# Patient Record
Sex: Male | Born: 1990 | Race: Black or African American | Hispanic: No | Marital: Married | Smoking: Never smoker
Health system: Southern US, Community
[De-identification: ages and names within clinical notes are randomized; demographics above are authoritative.]

---
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
## 2010-04-13 LAB — CONVERTED CEMR LAB
ALT: 17 units/L (ref 0–53)
AST: 38 units/L — ABNORMAL HIGH (ref 0–37)
Albumin: 5.2 g/dL (ref 3.5–5.2)
Alkaline Phosphatase: 107 units/L (ref 52–171)
BUN: 15 mg/dL (ref 6–23)
Basophils Absolute: 0 10*3/uL (ref 0.0–0.1)
Basophils Relative: 0 % (ref 0–1)
Bilirubin Urine: NEGATIVE
Blood in Urine, dipstick: NEGATIVE
CO2: 21 meq/L (ref 19–32)
Calcium: 10.1 mg/dL (ref 8.4–10.5)
Chlamydia, Swab/Urine, PCR: NEGATIVE
Chloride: 105 meq/L (ref 96–112)
Creatinine, Ser: 1.01 mg/dL (ref 0.40–1.50)
Eosinophils Absolute: 0.3 10*3/uL (ref 0.0–1.2)
Eosinophils Relative: 6 % — ABNORMAL HIGH (ref 0–5)
GC Probe Amp, Urine: NEGATIVE
Glucose, Bld: 77 mg/dL (ref 70–99)
Glucose, Urine, Semiquant: NEGATIVE
HCT: 45 % (ref 36.0–49.0)
Hemoglobin: 13.9 g/dL (ref 12.0–16.0)
Ketones, urine, test strip: NEGATIVE
Lymphocytes Relative: 40 % (ref 24–48)
Lymphs Abs: 2.1 10*3/uL (ref 1.1–4.8)
MCHC: 30.9 g/dL — ABNORMAL LOW (ref 31.0–37.0)
MCV: 102 fL — ABNORMAL HIGH (ref 78.0–98.0)
Monocytes Absolute: 0.5 10*3/uL (ref 0.2–1.2)
Monocytes Relative: 9 % (ref 3–11)
Neutro Abs: 2.4 10*3/uL (ref 1.7–8.0)
Neutrophils Relative %: 46 % (ref 43–71)
Nitrite: NEGATIVE
Platelets: 216 10*3/uL (ref 150–400)
Potassium: 5.1 meq/L (ref 3.5–5.3)
Protein, U semiquant: 30
RBC: 4.41 M/uL (ref 3.80–5.70)
RDW: 12.7 % (ref 11.4–15.5)
Sodium: 144 meq/L (ref 135–145)
Specific Gravity, Urine: >=1.03
TSH: 0.822 microintl units/mL (ref 0.350–5.50)
Total Bilirubin: 1 mg/dL (ref 0.3–1.2)
Total Protein: 8.3 g/dL (ref 6.0–8.3)
Urobilinogen, UA: 0.2
WBC Urine, dipstick: NEGATIVE
WBC: 5.3 10*3/uL (ref 4.5–13.5)
pH: 5.5

## 2010-04-15 NOTE — Letter (Signed)
Summary: AUTHORIZATION TO TALK TO MOTHER  AUTHORIZATION TO TALK TO MOTHER   Imported By: Arta Bruce 06/27/2009 14:14:29  _____________________________________________________________________  External Attachment:    Type:   Image     Comment:   External Document

## 2011-07-30 ENCOUNTER — Emergency Department (HOSPITAL_COMMUNITY)
Admission: EM | Admit: 2011-07-30 | Discharge: 2011-07-30 | Disposition: A | Discharge status: Home / Self Care | Payer: BC Managed Care – PPO | Source: Home / Self Care | Attending: Family Medicine | Admitting: Family Medicine

## 2011-07-30 ENCOUNTER — Encounter (HOSPITAL_COMMUNITY): Payer: Self-pay | Admitting: *Deleted

## 2011-07-30 DIAGNOSIS — L0100 Impetigo, unspecified: Secondary | ICD-10-CM

## 2011-07-30 MED ORDER — SULFAMETHOXAZOLE-TRIMETHOPRIM 800-160 MG PO TABS: 1.0000 | ORAL_TABLET | Freq: Two times a day (BID) | ORAL | Status: AC

## 2011-07-30 MED ORDER — MUPIROCIN CALCIUM 2 % NA OINT: TOPICAL_OINTMENT | NASAL | Status: DC

## 2011-07-30 NOTE — ED Provider Notes (Signed)
History     CSN: [id number]  Arrival date & time 07/30/11  1322   First MD Initiated Contact with Patient 07/30/11 1329      Chief Complaint  Patient presents with  . Rash    (Consider location/radiation/quality/duration/timing/severity/associated sxs/prior treatment) HPI Comments: The patient reports having a rash on both legs for several wks. States they for  Pustules and drain. Thighs are the only areas affected. Denies known hx of mrsa. No tx pta.   The history is provided by the patient.    History reviewed. No pertinent past medical history.  History reviewed. No pertinent past surgical history.  History reviewed. No pertinent family history.  History  Substance Use Topics  . Smoking status: Not on file  . Smokeless tobacco: Not on file  . Alcohol Use: Not on file      Review of Systems  Constitutional: Negative.   HENT: Negative.   Respiratory: Negative.   Gastrointestinal: Negative.   Genitourinary: Negative.   Musculoskeletal: Negative.     Allergies  Review of patient's allergies indicates not on file.  Home Medications   Current Outpatient Rx  Name Route Sig Dispense Refill  . MUPIROCIN CALCIUM 2 % NA OINT  Apply bid 10 g 0  . SULFAMETHOXAZOLE-TRIMETHOPRIM 800-160 MG PO TABS Oral Take 1 tablet by mouth every 12 (twelve) hours. 20 tablet 0    BP 129/83  Pulse 46  Temp(Src) 99 F (37.2 C) (Oral)  Resp 18  SpO2 100%  Physical Exam  Nursing note and vitals reviewed. Constitutional: He appears well-developed and well-nourished.  HENT:  Head: Atraumatic.  Cardiovascular: Normal rate and regular rhythm.   Pulmonary/Chest: Effort normal and breath sounds normal.  Skin: Skin is warm and dry.       Rash consistent with staff infection. Pustules noted. Lesions vary in sizes    ED Course  Procedures (including critical care time)  Labs Reviewed - No data to display No results found.   1. Impetigo       MDM          Randa Spike, MD 07/30/11 [phone]

## 2011-07-30 NOTE — ED Notes (Signed)
Pt  Reports    Rash  On  Both legs  For  Several weeks  Getting  Worse       The  Rash  Has  A  Red   Angry  Appearance  No  Known  Causative  Agents  Or  Any new  meds

## 2011-07-30 NOTE — Discharge Instructions (Signed)
Keep the areas clean. Impetigo Impetigo is an infection of the skin, most common in babies and children.  CAUSES  It is caused by staphylococcal or streptococcal germs (bacteria). Impetigo can start after any damage to the skin. The damage to the skin may be from things like:   Chickenpox.   Scrapes.   Scratches.   Insect bites (common when children scratch the bite).   Cuts.   Nail biting or chewing.  Impetigo is contagious. It can be spread from one person to another. Avoid close skin contact, or sharing towels or clothing. SYMPTOMS  Impetigo usually starts out as small blisters or pustules. Then they turn into tiny yellow-crusted sores (lesions).  There may also be:  Large blisters.   Itching or pain.   Pus.   Swollen lymph glands.  With scratching, irritation, or non-treatment, these small areas may get larger. Scratching can cause the germs to get under the fingernails; then scratching another part of the skin can cause the infection to be spread there. DIAGNOSIS  Diagnosis of impetigo is usually made by a physical exam. A skin culture (test to grow bacteria) may be done to prove the diagnosis or to help decide the best treatment.  TREATMENT  Mild impetigo can be treated with prescription antibiotic cream. Oral antibiotic medicine may be used in more severe cases. Medicines for itching may be used. HOME CARE INSTRUCTIONS   To avoid spreading impetigo to other body areas:   Keep fingernails short and clean.   Avoid scratching.   Cover infected areas if necessary to keep from scratching.   Gently wash the infected areas with antibiotic soap and water.   Soak crusted areas in warm soapy water using antibiotic soap.   Gently rub the areas to remove crusts. Do not scrub.   Wash hands often to avoid spread this infection.   Keep children with impetigo home from school or daycare until they have used an antibiotic cream for 48 hours (2 days) or oral antibiotic  medicine for 24 hours (1 day), and their skin shows significant improvement.   Children may attend school or daycare if they only have a few sores and if the sores can be covered by a bandage or clothing.  SEEK MEDICAL CARE IF:   More blisters or sores show up despite treatment.   Other family members get sores.   Rash is not improving after 48 hours (2 days) of treatment.  SEEK IMMEDIATE MEDICAL CARE IF:   You see spreading redness or swelling of the skin around the sores.   You see red streaks coming from the sores.   Your child develops a fever of 100.4 F (37.2 C) or higher.   Your child develops a sore throat.   Your child is acting ill (lethargic, sick to their stomach).  Document Released: 02/28/2000 Document Revised: 02/19/2011 Document Reviewed: 12/28/2007 [facility] Patient Information 2012 [place], [place].

## 2017-02-19 ENCOUNTER — Emergency Department (HOSPITAL_COMMUNITY)
Admission: EM | Admit: 2017-02-19 | Discharge: 2017-02-19 | Disposition: A | Discharge status: Home / Self Care | Payer: Self-pay | Attending: Emergency Medicine | Admitting: Emergency Medicine

## 2017-02-19 ENCOUNTER — Other Ambulatory Visit: Payer: Self-pay

## 2017-02-19 ENCOUNTER — Emergency Department (HOSPITAL_COMMUNITY): Payer: Self-pay

## 2017-02-19 ENCOUNTER — Encounter (HOSPITAL_COMMUNITY): Payer: Self-pay

## 2017-02-19 DIAGNOSIS — M546 Pain in thoracic spine: Secondary | ICD-10-CM | POA: Insufficient documentation

## 2017-02-19 DIAGNOSIS — Z041 Encounter for examination and observation following transport accident: Secondary | ICD-10-CM | POA: Insufficient documentation

## 2017-02-19 DIAGNOSIS — M542 Cervicalgia: Secondary | ICD-10-CM | POA: Insufficient documentation

## 2017-02-19 MED ORDER — IBUPROFEN 400 MG PO TABS
600.0000 mg | ORAL_TABLET | Freq: Once | ORAL | Status: DC
  Filled 2017-02-19: qty 1

## 2017-02-19 MED ORDER — METHOCARBAMOL 500 MG PO TABS
750.0000 mg | ORAL_TABLET | Freq: Once | ORAL | Status: AC
  Administered 2017-02-19: 750 mg via ORAL
  Filled 2017-02-19: qty 2

## 2017-02-19 MED ORDER — CYCLOBENZAPRINE HCL 10 MG PO TABS: 10.0000 mg | ORAL_TABLET | Freq: Every evening | ORAL | 0 refills | Status: DC | PRN

## 2017-02-19 MED ORDER — METHOCARBAMOL 500 MG PO TABS
750.0000 mg | ORAL_TABLET | Freq: Once | ORAL | Status: DC
  Filled 2017-02-19: qty 2

## 2017-02-19 MED ORDER — IBUPROFEN 400 MG PO TABS
600.0000 mg | ORAL_TABLET | Freq: Once | ORAL | Status: AC
  Administered 2017-02-19: 14:00:00 600 mg via ORAL
  Filled 2017-02-19: qty 1

## 2017-02-19 MED ORDER — NAPROXEN 500 MG PO TABS: 500.0000 mg | ORAL_TABLET | Freq: Two times a day (BID) | ORAL | 0 refills | Status: DC
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
## 2017-02-19 NOTE — ED Notes (Signed)
Patient transported to X-ray 
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
## 2017-02-19 NOTE — ED Notes (Signed)
Patient is in the room waiting for provider 

## 2019-12-03 ENCOUNTER — Other Ambulatory Visit: Payer: Self-pay

## 2019-12-03 ENCOUNTER — Emergency Department (HOSPITAL_COMMUNITY): Payer: 59

## 2019-12-03 ENCOUNTER — Emergency Department (HOSPITAL_COMMUNITY)
Admission: EM | Admit: 2019-12-03 | Discharge: 2019-12-04 | Disposition: A | Discharge status: Home / Self Care | Payer: 59 | Attending: Emergency Medicine | Admitting: Emergency Medicine

## 2019-12-03 DIAGNOSIS — Y92410 Unspecified street and highway as the place of occurrence of the external cause: Secondary | ICD-10-CM | POA: Diagnosis not present

## 2019-12-03 DIAGNOSIS — Z5321 Procedure and treatment not carried out due to patient leaving prior to being seen by health care provider: Secondary | ICD-10-CM | POA: Diagnosis not present

## 2019-12-03 DIAGNOSIS — M549 Dorsalgia, unspecified: Secondary | ICD-10-CM | POA: Insufficient documentation

## 2019-12-03 DIAGNOSIS — R0781 Pleurodynia: Secondary | ICD-10-CM | POA: Diagnosis present
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
## 2019-12-04 ENCOUNTER — Ambulatory Visit (HOSPITAL_COMMUNITY)
Admission: EM | Admit: 2019-12-04 | Discharge: 2019-12-04 | Disposition: A | Discharge status: Home / Self Care | Payer: 59 | Attending: Internal Medicine | Admitting: Internal Medicine

## 2019-12-04 ENCOUNTER — Encounter (HOSPITAL_COMMUNITY): Payer: Self-pay

## 2019-12-04 ENCOUNTER — Other Ambulatory Visit: Payer: Self-pay

## 2019-12-04 DIAGNOSIS — S2242XA Multiple fractures of ribs, left side, initial encounter for closed fracture: Secondary | ICD-10-CM | POA: Diagnosis not present

## 2019-12-04 MED ORDER — KETOROLAC TROMETHAMINE 30 MG/ML IJ SOLN
INTRAMUSCULAR | Status: AC
  Filled 2019-12-04: qty 1

## 2019-12-04 MED ORDER — KETOROLAC TROMETHAMINE 30 MG/ML IJ SOLN
30.0000 mg | Freq: Once | INTRAMUSCULAR | Status: AC
  Administered 2019-12-04: 30 mg via INTRAMUSCULAR

## 2019-12-04 MED ORDER — CYCLOBENZAPRINE HCL 10 MG PO TABS: 10.0000 mg | ORAL_TABLET | Freq: Three times a day (TID) | ORAL | 0 refills | Status: DC | PRN

## 2019-12-04 MED ORDER — IBUPROFEN 600 MG PO TABS: 600.0000 mg | ORAL_TABLET | Freq: Four times a day (QID) | ORAL | 0 refills | Status: DC | PRN
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
## 2020-01-01 ENCOUNTER — Emergency Department (HOSPITAL_COMMUNITY): Payer: 59

## 2020-01-01 ENCOUNTER — Emergency Department (HOSPITAL_COMMUNITY)
Admission: EM | Admit: 2020-01-01 | Discharge: 2020-01-01 | Disposition: A | Discharge status: Home / Self Care | Payer: 59 | Attending: Emergency Medicine | Admitting: Emergency Medicine

## 2020-01-01 ENCOUNTER — Encounter (HOSPITAL_COMMUNITY): Payer: Self-pay

## 2020-01-01 DIAGNOSIS — W3400XA Accidental discharge from unspecified firearms or gun, initial encounter: Secondary | ICD-10-CM | POA: Diagnosis not present

## 2020-01-01 DIAGNOSIS — Z23 Encounter for immunization: Secondary | ICD-10-CM | POA: Diagnosis not present

## 2020-01-01 DIAGNOSIS — S61402A Unspecified open wound of left hand, initial encounter: Secondary | ICD-10-CM | POA: Insufficient documentation

## 2020-01-01 MED ORDER — LIDOCAINE HCL 1 % IJ SOLN
5.0000 mL | Freq: Once | INTRAMUSCULAR | Status: AC
  Filled 2020-01-01: qty 10

## 2020-01-01 MED ORDER — CEPHALEXIN 500 MG PO CAPS: 500.0000 mg | ORAL_CAPSULE | Freq: Four times a day (QID) | ORAL | 0 refills | Status: DC

## 2020-01-01 MED ORDER — TETANUS-DIPHTH-ACELL PERTUSSIS 5-2.5-18.5 LF-MCG/0.5 IM SUSP
0.5000 mL | Freq: Once | INTRAMUSCULAR | Status: AC
  Administered 2020-01-01: 0.5 mL via INTRAMUSCULAR
  Filled 2020-01-01: qty 0.5

## 2020-01-01 MED ORDER — CEFTRIAXONE SODIUM 1 G IJ SOLR
1.0000 g | Freq: Once | INTRAMUSCULAR | Status: AC
  Administered 2020-01-01: 1 g via INTRAMUSCULAR
  Filled 2020-01-01: qty 10

## 2020-01-01 MED ORDER — LIDOCAINE HCL (PF) 1 % IJ SOLN
INTRAMUSCULAR | Status: AC
  Administered 2020-01-01: 5 mL via INTRADERMAL
  Filled 2020-01-01: qty 5
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
## 2020-04-18 ENCOUNTER — Ambulatory Visit (HOSPITAL_COMMUNITY)
Admission: EM | Admit: 2020-04-18 | Discharge: 2020-04-18 | Disposition: A | Discharge status: Home / Self Care | Payer: 59 | Attending: Family Medicine | Admitting: Family Medicine

## 2020-04-18 ENCOUNTER — Encounter (HOSPITAL_COMMUNITY): Payer: Self-pay

## 2020-04-18 ENCOUNTER — Other Ambulatory Visit: Payer: Self-pay

## 2020-04-18 DIAGNOSIS — K047 Periapical abscess without sinus: Secondary | ICD-10-CM

## 2020-04-18 MED ORDER — AMOXICILLIN-POT CLAVULANATE 875-125 MG PO TABS: 1.0000 | ORAL_TABLET | Freq: Two times a day (BID) | ORAL | 0 refills | Status: DC

## 2020-04-18 MED ORDER — LIDOCAINE VISCOUS HCL 2 % MT SOLN: 10.0000 mL | OROMUCOSAL | 0 refills | Status: DC | PRN

## 2020-04-18 NOTE — ED Provider Notes (Signed)
[facility]    CSN: [id number] Arrival date & time: 04/18/20  1443      History   Chief Complaint Chief Complaint  Patient presents with  . Abscess    HPI Matthew Huang is a 30 y.o. male.   Here today with about 4 day history of right lower dental pain and now marked jaw swelling and tenderness. Has had low grade fever today. No drainage from area, chills, sweats, CP, SOB, dizziness, headaches. Taking ibuprofen here and there with mild temporary relief. Just got a dentist but unable to get an appt in the next few days. Known broken teeth in the area.      History reviewed. No pertinent past medical history.  Patient Active Problem List   Diagnosis Date Noted  . 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 INFECTION, SKIN AND SOFT TISSUE 04/10/2008  . KNEE PAIN, LEFT 04/10/2008  . BRADYCARDIA 06/14/2007    History reviewed. No pertinent surgical history.     Home Medications    Prior to Admission medications   Medication Sig Start Date End Date Taking? Authorizing Provider  amoxicillin-clavulanate (AUGMENTIN) 875-125 MG tablet Take 1 tablet by mouth every 12 (twelve) hours. 04/18/20  Yes Particia Nearing, PA-C  lidocaine (XYLOCAINE) 2 % solution Use as directed 10 mLs in the mouth or throat as needed for mouth pain. 04/18/20  Yes Particia Nearing, PA-C  cephALEXin (KEFLEX) 500 MG capsule Take 1 capsule (500 mg total) by mouth 4 (four) times daily. 01/01/20   Sabino Donovan, MD  cyclobenzaprine (FLEXERIL) 10 MG tablet Take 1 tablet (10 mg total) by mouth 3 (three) times daily as needed for muscle spasms. 12/04/19   Merrilee Jansky, MD  ibuprofen (ADVIL) 600 MG tablet Take 1 tablet (600 mg total) by mouth every 6 (six) hours as needed. 12/04/19   Merrilee Jansky, MD  mupirocin nasal ointment (BACTROBAN) 2 % Apply bid 07/30/11 12/04/19  Randa Spike, DO    Family History History reviewed. No pertinent family history.  Social History Social History   Tobacco  Use  . Smoking status: Never Smoker  . Smokeless tobacco: Never Used  Substance Use Topics  . Alcohol use: Yes    Comment: occ  . Drug use: No     Allergies   Patient has no known allergies.   Review of Systems Review of Systems PER HPI  Physical Exam Triage Vital Signs ED Triage Vitals [04/18/20 1533]  Enc Vitals Group     BP 136/73     Pulse Rate 66     Resp 17     Temp 100 F (37.8 C)     Temp src      SpO2 100 %     Weight      Height      Head Circumference      Peak Flow      Pain Score 9     Pain Loc      Pain Edu?      Excl. in GC?    No data found.  Updated Vital Signs BP 136/73   Pulse 66   Temp 100 F (37.8 C)   Resp 17   SpO2 100%   Visual Acuity Right Eye Distance:   Left Eye Distance:   Bilateral Distance:    Right Eye Near:   Left Eye Near:    Bilateral Near:     Physical Exam Vitals and nursing note reviewed.  Constitutional:  Appearance: Normal appearance.  HENT:     Head: Atraumatic.     Mouth/Throat:     Mouth: Mucous membranes are moist.     Comments: Significant right lower jaw swelling, ttp Several broken molars right lower jaw, areas of decay present Eyes:     Extraocular Movements: Extraocular movements intact.     Conjunctiva/sclera: Conjunctivae normal.  Cardiovascular:     Rate and Rhythm: Normal rate and regular rhythm.  Pulmonary:     Effort: Pulmonary effort is normal.     Breath sounds: Normal breath sounds.  Musculoskeletal:        General: Normal range of motion.     Cervical back: Normal range of motion and neck supple.  Skin:    General: Skin is warm and dry.  Neurological:     General: No focal deficit present.     Mental Status: He is oriented to person, place, and time.  Psychiatric:        Mood and Affect: Mood normal.        Thought Content: Thought content normal.        Judgment: Judgment normal.     UC Treatments / Results  Labs (all labs ordered are listed, but only abnormal  results are displayed) Labs Reviewed - No data to display  EKG   Radiology No results found.  Procedures Procedures (including critical care time)  Medications Ordered in UC Medications - No data to display  Initial Impression / Assessment and Plan / UC Course  I have reviewed the triage vital signs and the nursing notes.  Pertinent labs & imaging results that were available during my care of the patient were reviewed by me and considered in my medical decision making (see chart for details).     Will start augmentin, viscous lidocaine, and OTC pain relievers. Close dental f/u recommended. Return if worsening or not resolving.   Final Clinical Impressions(s) / UC Diagnoses   Final diagnoses:  Dental infection   Discharge Instructions   None    ED Prescriptions    Medication Sig Dispense Auth. Provider   amoxicillin-clavulanate (AUGMENTIN) 875-125 MG tablet Take 1 tablet by mouth every 12 (twelve) hours. 14 tablet Particia Nearing, [place]   lidocaine (XYLOCAINE) 2 % solution Use as directed 10 mLs in the mouth or throat as needed for mouth pain. 100 mL Particia Nearing, [place]     PDMP not reviewed this encounter.   Particia Nearing, [place] 04/18/20 [phone]

## 2020-04-18 NOTE — ED Triage Notes (Signed)
Pt in with c/o right jaw swelling that he noticed 4 days ago. States it feels like an abscess  Pt has been taking ibuprofen for pain

## 2021-07-13 ENCOUNTER — Ambulatory Visit (HOSPITAL_COMMUNITY)
Admission: EM | Admit: 2021-07-13 | Discharge: 2021-07-13 | Disposition: A | Discharge status: Home / Self Care | Payer: Self-pay

## 2021-07-13 ENCOUNTER — Encounter (HOSPITAL_COMMUNITY): Payer: Self-pay | Admitting: Emergency Medicine

## 2021-07-13 ENCOUNTER — Ambulatory Visit (HOSPITAL_COMMUNITY)
Admission: EM | Admit: 2021-07-13 | Discharge: 2021-07-13 | Disposition: A | Discharge status: Home / Self Care | Payer: Self-pay | Attending: Emergency Medicine | Admitting: Emergency Medicine

## 2021-07-13 DIAGNOSIS — R200 Anesthesia of skin: Secondary | ICD-10-CM

## 2021-07-13 DIAGNOSIS — B354 Tinea corporis: Secondary | ICD-10-CM

## 2021-07-13 MED ORDER — GABAPENTIN 100 MG PO CAPS: 100.0000 mg | ORAL_CAPSULE | Freq: Every day | ORAL | 0 refills | Status: DC
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
## 2021-07-13 NOTE — ED Provider Notes (Signed)
?[facility] ? ? ? ?CSN: [id number] ?Arrival date & time: 07/13/21  1717 ? ? ?  ? ?History   ?Chief Complaint ?Chief Complaint  ?Patient presents with  ? Numbness  ? ? ?HPI ?Matthew Huang is a 31 y.o. male.  ? ?Patient presents with tingling to bilateral fingers for 5 days.  Tingling has been constant and has not worsened.  Does not extend past fingertips.  Range of motion intact.  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  Denies blistering, discoloration, numbness.  Has not attempted treatment. ? ?Patient concerned with rash present to the chest and neck, unsure of timeline of presents.  Rash is pruritic but nondraining.  Has not spread but has worsened.  Denies fever, chills .  Endorses that he has had similar rash in the past that cleared with the use of head and shoulder shampoo.   ? ?History reviewed. No pertinent past medical history. ? ?Patient Active Problem List  ? Diagnosis Date Noted  ? 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 INFECTION, SKIN AND SOFT TISSUE 04/10/2008  ? KNEE PAIN, LEFT 04/10/2008  ? 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 ? ?History reviewed. No pertinent surgical history. ? ? ? ? ?Home Medications   ? ?Prior to Admission medications   ?Medication Sig Start Date End Date Taking? Authorizing Provider  ?amoxicillin-clavulanate (AUGMENTIN) 875-125 MG tablet Take 1 tablet by mouth every 12 (twelve) hours. 04/18/20   Particia Nearing, PA-C  ?cephALEXin (KEFLEX) 500 MG capsule Take 1 capsule (500 mg total) by mouth 4 (four) times daily. 01/01/20   Sabino Donovan, MD  ?cyclobenzaprine (FLEXERIL) 10 MG tablet Take 1 tablet (10 mg total) by mouth 3 (three) times daily as needed for muscle spasms. 12/04/19   LampteyBritta Mccreedy, MD  ?ibuprofen (ADVIL) 600 MG tablet Take 1 tablet (600 mg total) by mouth every 6 (six) hours as needed. 12/04/19   LampteyBritta Mccreedy, MD  ?lidocaine (XYLOCAINE) 2 % solution Use as directed 10 mLs in the mouth or throat as needed  for mouth pain. 04/18/20   Particia Nearing, PA-C  ?mupirocin nasal ointment (BACTROBAN) 2 % Apply bid 07/30/11 12/04/19  Randa Spike, DO  ? ? ?Family History ?History reviewed. No pertinent family history. ? ?Social History ?Social History  ? ?Tobacco Use  ? Smoking status: Never  ? Smokeless tobacco: Never  ?Substance Use Topics  ? Alcohol use: Yes  ?  Comment: occ  ? Drug use: No  ? ? ? ?Allergies   ?Patient has no known allergies. ? ? ?Review of Systems ?Review of Systems ?Defer to HPI  ? ? ?Physical Exam ?Triage Vital Signs ?ED Triage Vitals  ?Enc Vitals Group  ?   BP 07/13/21 1803 126/84  ?   Pulse Rate 07/13/21 1803 62  ?   Resp 07/13/21 1803 17  ?   Temp 07/13/21 1803 98.3 ?F (36.8 ?C)  ?   Temp Source 07/13/21 1803 Oral  ?   SpO2 07/13/21 1803 97 %  ?   Weight 07/13/21 1802 145 lb 1 oz (65.8 kg)  ?   Height 07/13/21 1802 5\' 7"  (1.702 m)  ?   Head Circumference --   ?   Peak Flow --   ?   Pain Score 07/13/21 1802 7  ?   Pain Loc --   ?   Pain Edu? --   ?   Excl. in GC? --   ? ?  No data found. ? ?Updated Vital Signs ?BP 126/84 (BP Location: Right Arm)   Pulse 62   Temp 98.3 ?F (36.8 ?C) (Oral)   Resp 17   Ht 5\' 7"  (1.702 m)   Wt 145 lb 1 oz (65.8 kg)   SpO2 97%   BMI 22.72 kg/m?  ? ?Visual Acuity ?Right Eye Distance:   ?Left Eye Distance:   ?Bilateral Distance:   ? ?Right Eye Near:   ?Left Eye Near:    ?Bilateral Near:    ? ?Physical Exam ?Constitutional:   ?   Appearance: Normal appearance.  ?HENT:  ?   Head: Normocephalic.  ?Eyes:  ?   Extraocular Movements: Extraocular movements intact.  ?Pulmonary:  ?   Effort: Pulmonary effort is normal.  ?Musculoskeletal:  ?   Comments: Has a heightened sensation in all 5 fingers bilaterally, does not extend past the distal phalanx, range of motion intact, capillary refill less than 3, no involvement of the palm or wrist  ?Skin: ?   Comments: Circular shaped hypopigmentation scattered over the abdomen, chest, anterior neck, mild scaling noted   ?Neurological:  ?   Mental Status: He is alert and oriented to person, place, and time. Mental status is at baseline.  ?Psychiatric:     ?   Mood and Affect: Mood normal.     ?   Behavior: Behavior normal.  ? ? ? ?UC Treatments / Results  ?Labs ?(all labs ordered are listed, but only abnormal results are displayed) ?Labs Reviewed - No data to display ? ?EKG ? ? ?Radiology ?No results found. ? ?Procedures ?Procedures (including critical care time) ? ?Medications Ordered in UC ?Medications - No data to display ? ?Initial Impression / Assessment and Plan / UC Course  ?I have reviewed the triage vital signs and the nursing notes. ? ?Pertinent labs & imaging results that were available during my care of the patient were reviewed by me and considered in my medical decision making (see chart for details). ? ?Numbness of fingers of both hands ?Tinea corporis ? ? ?Unknown etiology of symptoms, possibly related to exposure to cold temperatures, discussed with patient, no signs of blistering, discoloration, sensation is currently intact, prescribe gabapentin 100 mg at bedtime daily for management of tingling, if symptoms continue to persist, discussed with patient follow-up with vascular ? ?Symptomology of rash is consistent with tinea corporis as patient has endorsed that symptoms have cleared with over-the-counter antifungal shampoo, recommended restarting and using 5 days past resolution, they may use as needed, may follow-up at urgent care if symptoms worsen ?Final Clinical Impressions(s) / UC Diagnoses  ? ?Final diagnoses:  ?None  ? ?Discharge Instructions   ?None ?  ? ?ED Prescriptions   ?None ?  ? ?PDMP not reviewed this encounter. ?  ? , NP ?07/16/21 1000 ? ?

## 2021-07-13 NOTE — Discharge Instructions (Addendum)
For your fingers ? 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 ?I would like you to follow-up with vascular for evaluation of your circulation if your symptoms continue to persist, information has been listed below ? ?Begin use of gabapentin daily before bed to see if this will help minimize your tingling sensation ? 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 ?For your rash ? ?Symptoms are consistent with a fungal rash, if it has been affected to use over-the-counter shampoos you may continue to do so with follow-up with urgent care if shampoo ever becomes ineffective ?

## 2021-07-13 NOTE — ED Notes (Signed)
No answer from lobby  

## 2021-08-29 ENCOUNTER — Ambulatory Visit (INDEPENDENT_AMBULATORY_CARE_PROVIDER_SITE_OTHER): Payer: Self-pay

## 2021-08-29 ENCOUNTER — Encounter (HOSPITAL_COMMUNITY): Payer: Self-pay

## 2021-08-29 ENCOUNTER — Ambulatory Visit (HOSPITAL_COMMUNITY)
Admission: EM | Admit: 2021-08-29 | Discharge: 2021-08-29 | Disposition: A | Discharge status: Home / Self Care | Payer: Self-pay | Attending: Emergency Medicine | Admitting: Emergency Medicine

## 2021-08-29 DIAGNOSIS — L0201 Cutaneous abscess of face: Secondary | ICD-10-CM

## 2021-08-29 DIAGNOSIS — B354 Tinea corporis: Secondary | ICD-10-CM

## 2021-08-29 DIAGNOSIS — M25562 Pain in left knee: Secondary | ICD-10-CM

## 2021-08-29 MED ORDER — CLOTRIMAZOLE 1 % EX CREA: TOPICAL_CREAM | CUTANEOUS | 0 refills | Status: DC

## 2021-08-29 MED ORDER — IBUPROFEN 800 MG PO TABS
800.0000 mg | ORAL_TABLET | Freq: Once | ORAL | Status: AC
  Administered 2021-08-29: 800 mg via ORAL

## 2021-08-29 MED ORDER — AMOXICILLIN-POT CLAVULANATE 875-125 MG PO TABS: 1.0000 | ORAL_TABLET | Freq: Two times a day (BID) | ORAL | 0 refills | Status: AC

## 2021-08-29 MED ORDER — IBUPROFEN 800 MG PO TABS
ORAL_TABLET | ORAL | Status: AC
  Filled 2021-08-29: qty 1

## 2021-08-29 NOTE — Discharge Instructions (Addendum)
Please follow up with orthopedics. Alternate tylenol and ibuprofen every 6 hours for pain. Continue to use the ACE bandage wrap, elevate the leg, and apply ice to the area.  For the abscess, take the antibiotic medicine twice daily for 7 days. Please follow up with your dentist.   For the rash, apply the antifungal cream once or twice a day to the areas. Follow up with your primary care if this persists.

## 2021-08-29 NOTE — ED Provider Notes (Signed)
[facility]    CSN: [id number] Arrival date & time: 08/29/21  1831      History   Chief Complaint Chief Complaint  Patient presents with   Knee Injury   Abscess    HPI Matthew Huang is a 31 y.o. male.  2 day history of left knee pain after injury during basketball game. Pain and swelling. Has been applying ACE bandage.   Abscess to right lower face on cheek. Has history of this in the past.   Seen a few months ago for rash that was treated with antifungal shampoo. Rash has persisted.   History reviewed. No pertinent past medical history.  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 No pertinent surgical history.     Home Medications    Prior to Admission medications   Medication Sig Start Date End Date Taking? Authorizing Provider  amoxicillin-clavulanate (AUGMENTIN) 875-125 MG tablet Take 1 tablet by mouth 2 (two) times daily for 7 days. 08/29/21 09/05/21 Yes Lovie Zarling, Lurena Joiner, PA-C  clotrimazole (LOTRIMIN) 1 % cream Apply to affected area 2 times daily 08/29/21  Yes Pratham Cassatt, Lurena Joiner, PA-C  mupirocin nasal ointment (BACTROBAN) 2 % Apply bid 07/30/11 12/04/19  Randa Spike, DO    Family History History reviewed. No pertinent family history.  Social History Social History   Tobacco Use   Smoking status: Never   Smokeless tobacco: Never  Substance Use Topics   Alcohol use: Yes    Comment: occ   Drug use: No     Allergies   Patient has no known allergies.   Review of Systems Review of Systems Per HPI   Physical Exam Triage Vital Signs ED Triage Vitals  Enc Vitals Group     BP 08/29/21 1925 124/76     Pulse Rate 08/29/21 1925 65     Resp 08/29/21 1925 18     Temp 08/29/21 1925 98.3 F (36.8 C)     Temp Source 08/29/21 1925 Oral     SpO2 08/29/21 1925 100 %     Weight --       Height --      Head Circumference --      Peak Flow --      Pain Score 08/29/21 1926 10     Pain Loc --      Pain Edu? --      Excl. in GC? --    No data found.  Updated Vital Signs BP 124/76 (BP Location: Left Arm)   Pulse 65   Temp 98.3 F (36.8 C) (Oral)   Resp 18   SpO2 100%   Visual Acuity Right Eye Distance:   Left Eye Distance:   Bilateral Distance:    Right Eye Near:   Left Eye Near:    Bilateral Near:     Physical Exam Skin:    Findings: Rash present.     Comments: Brown macular, circular rash consistent with tinea corporis, to right chest and right back     UC Treatments / Results  Labs (all labs ordered are listed, but only abnormal results are displayed) Labs Reviewed - No data to display  EKG   Radiology DG Knee AP/LAT W/Sunrise Left  Result Date: 08/29/2021 CLINICAL DATA:  Trauma to the left knee. EXAM: LEFT KNEE 3 VIEWS COMPARISON:  None Available.  FINDINGS: No evidence of fracture, dislocation, or joint effusion. No evidence of arthropathy or other focal bone abnormality. Soft tissues are unremarkable. IMPRESSION: Negative. Electronically Signed   By: Elgie Collard M.D.   On: 08/29/2021 20:18    Procedures Procedures (including critical care time)  Medications Ordered in UC Medications  ibuprofen (ADVIL) tablet 800 mg (800 mg Oral Given 08/29/21 2002)    Initial Impression / Assessment and Plan / UC Course  I have reviewed the triage vital signs and the nursing notes.  Pertinent labs & imaging results that were available during my care of the patient were reviewed by me and considered in my medical decision making (see chart for details).  Ibuprofen dose given.   Knee xray negative. Follow up with ortho. Clotrimazole cream for rash Antibiotic and follow up with dentist for facial abscess. Return precautions discussed.  Final Clinical Impressions(s) / UC Diagnoses   Final diagnoses:  Abscess of face  Acute pain of left knee   Tinea corporis     Discharge Instructions      Please follow up with orthopedics. Alternate tylenol and ibuprofen every 6 hours for pain. Continue to use the ACE bandage wrap, elevate the leg, and apply ice to the area.  For the abscess, take the antibiotic medicine twice daily for 7 days. Please follow up with your dentist.   For the rash, apply the antifungal cream once or twice a day to the areas. Follow up with your primary care if this persists.    ED Prescriptions     Medication Sig Dispense Auth. Provider   amoxicillin-clavulanate (AUGMENTIN) 875-125 MG tablet Take 1 tablet by mouth 2 (two) times daily for 7 days. 14 tablet Jareth Pardee, PA-C   clotrimazole (LOTRIMIN) 1 % cream Apply to affected area 2 times daily 15 g Gudrun Axe, Lurena Joiner, PA-C      PDMP not reviewed this encounter.

## 2021-08-29 NOTE — ED Triage Notes (Signed)
Pt states playing basketball and some ones knee when into the back of his lt knee. C/o pain and swelling x2 days.  C/o abscess to rt lower cheek x2 days. Denies toothache but does have a cracked tooth there.  States was here a few months back about a rash to abdomen and its still there.
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
## 2022-01-24 ENCOUNTER — Encounter (HOSPITAL_COMMUNITY): Payer: Self-pay | Admitting: Emergency Medicine

## 2022-01-24 ENCOUNTER — Ambulatory Visit (HOSPITAL_COMMUNITY)
Admission: EM | Admit: 2022-01-24 | Discharge: 2022-01-24 | Disposition: A | Discharge status: Home / Self Care | Payer: Medicaid Other | Attending: Emergency Medicine | Admitting: Emergency Medicine

## 2022-01-24 DIAGNOSIS — L0201 Cutaneous abscess of face: Secondary | ICD-10-CM

## 2022-01-24 MED ORDER — DOXYCYCLINE HYCLATE 100 MG PO CAPS: 100.0000 mg | ORAL_CAPSULE | Freq: Two times a day (BID) | ORAL | 0 refills | Status: DC

## 2022-01-24 NOTE — Discharge Instructions (Signed)
Due to the firmness of your abscess today we are not able to open and drain it as minimal pus will come out however the site has already started to drain and and most likely will continue to do so, the antibiotic will help this  Take doxycycline every morning and every evening for 7 days  Hold warm-hot compresses to affected area at least 4 times a day, this helps to facilitate draining, the more the better  Please return for evaluation for increased swelling, increased tenderness or pain, non healing site, non draining site, you begin to have fever or chills   We reviewed the etiology of recurrent abscesses of skin.  Skin abscesses are collections of pus within the dermis and deeper skin tissues. Skin abscesses manifest as painful, tender, fluctuant, and erythematous nodules, frequently surmounted by a pustule and surrounded by a rim of erythematous swelling.  Spontaneous drainage of purulent material may occur.  Fever can occur on occasion.    -Skin abscesses can develop in healthy individuals with no predisposing conditions other than skin or nasal carriage of Staphylococcus aureus.  Individuals in close contact with others who have active infection with skin abscesses are at increased risk which is likely to explain why twin brother has similar episodes.   In addition, any process leading to a breach in the skin barrier can also predispose to the development of a skin abscesses, such as atopic dermatitis.

## 2022-01-24 NOTE — ED Provider Notes (Signed)
[facility]    CSN: [id number] Arrival date & time: 01/24/22  1450      History   Chief Complaint Chief Complaint  Patient presents with   Facial Abscess    HPI Matthew Huang is a 31 y.o. male.   Patient presents with abscess to the right cheek beginning 1 day ago.  Endorses it has increased in size and become painful and has begun to drain.  Has occurred prior.  Denies shaving.  Denies fever or chills.  Has not attempted treatment.  History reviewed. No pertinent past medical history.  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 No pertinent surgical history.     Home Medications    Prior to Admission medications   Medication Sig Start Date End Date Taking? Authorizing Provider  doxycycline (VIBRAMYCIN) 100 MG capsule Take 1 capsule (100 mg total) by mouth 2 (two) times daily. 01/24/22  Yes Valinda Hoar, NP  clotrimazole (LOTRIMIN) 1 % cream Apply to affected area 2 times daily 08/29/21   Rising, Lurena Joiner, PA-C  mupirocin nasal ointment (BACTROBAN) 2 % Apply bid 07/30/11 12/04/19  Randa Spike, DO    Family History History reviewed. No pertinent family history.  Social History Social History   Tobacco Use   Smoking status: Never   Smokeless tobacco: Never  Substance Use Topics   Alcohol use: Yes    Comment: occ   Drug use: No     Allergies   Patient has no known allergies.   Review of Systems Review of Systems  Constitutional: Negative.   Respiratory: Negative.    Cardiovascular: Negative.   Skin:  Positive for wound. Negative for color change, pallor and rash.     Physical Exam Triage Vital Signs ED Triage Vitals  Enc Vitals Group     BP 01/24/22 1603 125/82     Pulse Rate 01/24/22 1603 62     Resp 01/24/22 1603 18     Temp 01/24/22 1603 98.2 F (36.8 C)      Temp Source 01/24/22 1603 Oral     SpO2 01/24/22 1603 98 %     Weight --      Height --      Head Circumference --      Peak Flow --      Pain Score 01/24/22 1602 10     Pain Loc --      Pain Edu? --      Excl. in GC? --    No data found.  Updated Vital Signs BP 125/82 (BP Location: Left Arm)   Pulse 62   Temp 98.2 F (36.8 C) (Oral)   Resp 18   SpO2 98%   Visual Acuity Right Eye Distance:   Left Eye Distance:   Bilateral Distance:    Right Eye Near:   Left Eye Near:    Bilateral Near:     Physical Exam Constitutional:      Appearance: Normal appearance.  HENT:     Head: Normocephalic.  Eyes:     Extraocular Movements: Extraocular movements intact.  Pulmonary:     Effort: Pulmonary effort is normal.  Skin:    Comments: 3 x 4 firm abscess present to the right external cheek, yellow puslike drainage present to the center pinpoint  Neurological:     Mental  Status: He is alert and oriented to person, place, and time. Mental status is at baseline.  Psychiatric:        Mood and Affect: Mood normal.        Behavior: Behavior normal.      UC Treatments / Results  Labs (all labs ordered are listed, but only abnormal results are displayed) Labs Reviewed - No data to display  EKG   Radiology No results found.  Procedures Procedures (including critical care time)  Medications Ordered in UC Medications - No data to display  Initial Impression / Assessment and Plan / UC Course  I have reviewed the triage vital signs and the nursing notes.  Pertinent labs & imaging results that were available during my care of the patient were reviewed by me and considered in my medical decision making (see chart for details).  Abscess of external cheek, right  Abscess is firm at this time and unable to undergo incision and drainage, discussed with patient, yellow purulent drainage already expelling from site therefore most likely will continue to do so, doxycycline  prescribed and amended warm compresses to the affected area to help facilitate drainage, may use over-the-counter analgesics for management of pain, advised to return urgent care for nonhealing site for reevaluation as needed Final Clinical Impressions(s) / UC Diagnoses   Final diagnoses:  Abscess of external cheek, left     Discharge Instructions      Due to the firmness of your abscess today we are not able to open and drain it as minimal pus will come out however the site has already started to drain and and most likely will continue to do so, the antibiotic will help this  Take doxycycline every morning and every evening for 7 days  Hold warm-hot compresses to affected area at least 4 times a day, this helps to facilitate draining, the more the better  Please return for evaluation for increased swelling, increased tenderness or pain, non healing site, non draining site, you begin to have fever or chills   We reviewed the etiology of recurrent abscesses of skin.  Skin abscesses are collections of pus within the dermis and deeper skin tissues. Skin abscesses manifest as painful, tender, fluctuant, and erythematous nodules, frequently surmounted by a pustule and surrounded by a rim of erythematous swelling.  Spontaneous drainage of purulent material may occur.  Fever can occur on occasion.    -Skin abscesses can develop in healthy individuals with no predisposing conditions other than skin or nasal carriage of Staphylococcus aureus.  Individuals in close contact with others who have active infection with skin abscesses are at increased risk which is likely to explain why twin brother has similar episodes.   In addition, any process leading to a breach in the skin barrier can also predispose to the development of a skin abscesses, such as atopic dermatitis.      ED Prescriptions     Medication Sig Dispense Auth. Provider   doxycycline (VIBRAMYCIN) 100 MG capsule Take 1 capsule (100 mg  total) by mouth 2 (two) times daily. 14 capsule Haevyn Ury, Elita Boone, NP      PDMP not reviewed this encounter.   Valinda Hoar, NP 01/24/22 1649

## 2022-01-24 NOTE — ED Triage Notes (Signed)
Pt reports a facial abscess on the right side. States it has been there for months but returned lastnight.

## 2023-04-26 ENCOUNTER — Encounter (HOSPITAL_COMMUNITY): Payer: Self-pay

## 2023-04-26 ENCOUNTER — Ambulatory Visit (HOSPITAL_COMMUNITY)
Admission: EM | Admit: 2023-04-26 | Discharge: 2023-04-26 | Disposition: A | Discharge status: Home / Self Care | Payer: Medicaid Other | Attending: Emergency Medicine | Admitting: Emergency Medicine

## 2023-04-26 DIAGNOSIS — L739 Follicular disorder, unspecified: Secondary | ICD-10-CM

## 2023-04-26 DIAGNOSIS — F1290 Cannabis use, unspecified, uncomplicated: Secondary | ICD-10-CM

## 2023-04-26 DIAGNOSIS — K047 Periapical abscess without sinus: Secondary | ICD-10-CM

## 2023-04-26 MED ORDER — MUPIROCIN 2 % EX OINT: 1.0000 | TOPICAL_OINTMENT | Freq: Two times a day (BID) | CUTANEOUS | 0 refills | Status: DC

## 2023-04-26 MED ORDER — AMOXICILLIN 500 MG PO CAPS: 500.0000 mg | ORAL_CAPSULE | Freq: Three times a day (TID) | ORAL | 0 refills | Status: DC

## 2023-04-26 MED ORDER — MUPIROCIN 2 % EX OINT: 1.0000 | TOPICAL_OINTMENT | Freq: Two times a day (BID) | CUTANEOUS | 0 refills | Status: AC

## 2023-04-26 NOTE — ED Triage Notes (Signed)
 Pt c/o abscess to rt lower cheek for over a month. States now its draining and having a toothaches to same area.

## 2023-04-26 NOTE — Discharge Instructions (Addendum)
 Take amoxicillin  as prescribed Use mupirocin  as prescribed Follow-up with dentist-call for appointment You have new or worsening issues or concerns go to the emergency room for further evaluation Return as needed Stop Smoking as it makes dental issues worse

## 2023-04-26 NOTE — ED Provider Notes (Signed)
 [facility]    CSN: [id number] Arrival date & time: 04/26/23  0935      History   Chief Complaint Chief Complaint  Patient presents with   Abscess    HPI Matthew Huang is a 33 y.o. male.   33 year old male pt, Matthew Huang, presents to urgent care for evaluation of abscess to right lower cheek for months.  Patient states it is now draining and he also has a toothache to the same area. Pt endorses smoking marijuana. Has not seen dentist.   Past medical history of: Cerebral palsy(infantile), knee pain, dental abscess ,bradycardia  The history is provided by the patient. No language interpreter was used.    History reviewed. No pertinent past medical history.  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 No pertinent surgical history.     Home Medications    Prior to Admission medications   Medication Sig Start Date End Date Taking? Authorizing Provider  amoxicillin  (AMOXIL ) 500 MG capsule Take 1 capsule (500 mg total) by mouth 3 (three) times daily. 04/26/23  Yes Katrell Milhorn, NP  mupirocin  ointment (BACTROBAN ) 2 % Apply 1 Application topically 2 (two) times daily for 7 days. Right jaw 04/26/23 05/03/23  Shmiel Morton, Eveleen Hinds, NP  mupirocin  nasal ointment (BACTROBAN ) 2 % Apply bid 07/30/11 12/04/19  Suzzane Estes, DO    Family History History reviewed. No pertinent family history.  Social History Social History   Tobacco Use   Smoking status: Never   Smokeless tobacco: Never  Substance Use Topics   Alcohol use: Yes    Comment: occ   Drug use: No     Allergies   Patient has no known allergies.   Review of Systems Review of Systems  Constitutional:  Negative for  fever.  HENT:  Positive for dental problem. Negative for facial swelling.   All other systems reviewed and are negative.    Physical Exam Triage Vital Signs ED Triage Vitals [04/26/23 1053]  Encounter Vitals Group     BP (!) 160/75     Systolic BP Percentile      Diastolic BP Percentile      Pulse Rate (!) 49     Resp 18     Temp 98.6 F (37 C)     Temp Source Oral     SpO2 98 %     Weight      Height      Head Circumference      Peak Flow      Pain Score 6     Pain Loc      Pain Education      Exclude from Growth Chart    No data found.  Updated Vital Signs BP (!) 160/75 (BP Location: Left Arm)   Pulse (!) 49   Temp 98.6 F (37 C) (Oral)   Resp 18   SpO2 98%   Visual Acuity Right Eye Distance:   Left Eye Distance:   Bilateral Distance:    Right Eye Near:   Left Eye Near:    Bilateral Near:     Physical Exam Vitals and nursing note reviewed.  Constitutional:      General: He is not in acute  distress.    Appearance: He is well-developed and well-groomed.  HENT:     Head: Normocephalic and atraumatic.     Mouth/Throat:     Lips: Pink.     Mouth: Mucous membranes are moist.     Dentition: Abnormal dentition. Gingival swelling and dental caries present.     Comments: Poor dentition of molars throughout, +gingival swelling of right lower molars,no purulent drainage noted,no tenting,no trismus Eyes:     Conjunctiva/sclera: Conjunctivae normal.  Cardiovascular:     Rate and Rhythm: Normal rate and regular rhythm.     Heart sounds: Normal heart sounds. No murmur heard. Pulmonary:     Effort: Pulmonary effort is normal. No respiratory distress.  Abdominal:     Palpations: Abdomen is soft.     Tenderness: There is no abdominal tenderness.  Musculoskeletal:        General: No swelling.     Cervical back: Neck supple.  Skin:    General: Skin is warm and dry.     Capillary Refill: Capillary refill takes less than 2 seconds.       Neurological:      General: No focal deficit present.     Mental Status: He is alert and oriented to person, place, and time.     GCS: GCS eye subscore is 4. GCS verbal subscore is 5. GCS motor subscore is 6.     Cranial Nerves: No cranial nerve deficit.     Sensory: No sensory deficit.  Psychiatric:        Attention and Perception: Attention normal.        Mood and Affect: Mood normal.        Speech: Speech normal.        Behavior: Behavior normal. Behavior is cooperative.      UC Treatments / Results  Labs (all labs ordered are listed, but only abnormal results are displayed) Labs Reviewed - No data to display  EKG   Radiology No results found.  Procedures Procedures (including critical care time)  Medications Ordered in UC Medications - No data to display  Initial Impression / Assessment and Plan / UC Course  I have reviewed the triage vital signs and the nursing notes.  Pertinent labs & imaging results that were available during my care of the patient were reviewed by me and considered in my medical decision making (see chart for details).    Discussed exam findings and plan of care with patient, strict go to ER precautions given.   Patient verbalized understanding to this provider.  DDX: Dental pain,dental caries, folliculitis,gingivitis Final Clinical Impressions(s) / UC Diagnoses   Final diagnoses:  Folliculitis  Dental infection  Marijuana user     Discharge Instructions      Take amoxicillin  as prescribed Use mupirocin  as prescribed Follow-up with dentist-call for appointment You have new or worsening issues or concerns go to the emergency room for further evaluation Return as needed Stop Smoking as it makes dental issues worse     ED Prescriptions     Medication Sig Dispense Auth. Provider   amoxicillin  (AMOXIL ) 500 MG capsule Take 1 capsule (500 mg total) by mouth 3 (three) times daily. 21 capsule Tameyah Koch, NP   mupirocin  ointment (BACTROBAN ) 2 %   (Status: Discontinued) Apply 1 Application topically 2 (two) times daily for 7 days. Right wrist 15 g Tyrick Dunagan, NP   mupirocin  ointment (BACTROBAN ) 2 % Apply 1 Application topically 2 (two) times daily for 7 days. Right  jaw 22 g Madline Oesterling, Eveleen Hinds, NP      PDMP not reviewed this encounter.   Peter Brands, NP 04/26/23 [phone]

## 2023-12-28 ENCOUNTER — Encounter (HOSPITAL_COMMUNITY): Payer: Self-pay

## 2023-12-28 ENCOUNTER — Ambulatory Visit (HOSPITAL_COMMUNITY)
Admission: EM | Admit: 2023-12-28 | Discharge: 2023-12-28 | Disposition: A | Discharge status: Home / Self Care | Attending: Family Medicine | Admitting: Family Medicine

## 2023-12-28 DIAGNOSIS — K047 Periapical abscess without sinus: Secondary | ICD-10-CM | POA: Diagnosis not present

## 2023-12-28 DIAGNOSIS — L237 Allergic contact dermatitis due to plants, except food: Secondary | ICD-10-CM

## 2023-12-28 MED ORDER — PREDNISONE 20 MG PO TABS: 40.0000 mg | ORAL_TABLET | Freq: Every day | ORAL | 0 refills | Status: AC

## 2023-12-28 MED ORDER — DEXAMETHASONE SOD PHOSPHATE PF 10 MG/ML IJ SOLN
10.0000 mg | Freq: Once | INTRAMUSCULAR | Status: AC
  Administered 2023-12-28: 10 mg via INTRAMUSCULAR

## 2023-12-28 MED ORDER — AMOXICILLIN-POT CLAVULANATE 875-125 MG PO TABS: 1.0000 | ORAL_TABLET | Freq: Two times a day (BID) | ORAL | 0 refills | Status: AC

## 2023-12-28 NOTE — ED Provider Notes (Signed)
 [facility]    CSN: [id number] Arrival date & time: 12/28/23  1846      History   Chief Complaint Chief Complaint  Patient presents with   Rash   Abscess    HPI Matthew Huang is a 33 y.o. male.    Rash Abscess  Here for swelling along his right lower jaw and in his mouth around his right lower teeth.  He has had this problem off and on.  He was told to go to the dentist but they could not take him for some time.  No fever  He also had a rash for about 3 days on his arms and back and legs.  It is very pruritic.  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 No pertinent past medical history.  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 No pertinent surgical history.     Home Medications    Prior to Admission medications   Medication Sig Start Date End Date Taking? Authorizing Provider  amoxicillin -clavulanate (AUGMENTIN ) 875-125 MG tablet Take 1 tablet by mouth 2 (two) times daily for 7 days. 12/28/23 01/04/24 Yes Renu Asby K, MD  predniSONE (DELTASONE) 20 MG tablet Take 2 tablets (40 mg total) by mouth daily with breakfast for 5 days. 12/28/23 01/02/24 Yes Demetrius Barrell K, MD  mupirocin  nasal ointment (BACTROBAN ) 2 % Apply bid 07/30/11 12/04/19  Nonnie Suzen MATSU, DO    Family History History reviewed. No pertinent family history.  Social History Social History   Tobacco Use   Smoking status: Never   Smokeless tobacco: Never  Vaping Use   Vaping status: Never Used  Substance Use Topics   Alcohol use: Yes    Comment: occ   Drug use: No     Allergies   Patient has no known allergies.   Review of Systems Review of Systems   Skin:  Positive for rash.     Physical Exam Triage Vital Signs ED Triage Vitals  Encounter Vitals Group     BP 12/28/23 1914 130/88     Girls Systolic BP Percentile --      Girls Diastolic BP Percentile --      Boys Systolic BP Percentile --      Boys Diastolic BP Percentile --      Pulse Rate 12/28/23 1914 (!) 57     Resp 12/28/23 1914 16     Temp 12/28/23 1914 98.3 F (36.8 C)     Temp Source 12/28/23 1914 Oral     SpO2 12/28/23 1914 97 %     Weight --      Height --      Head Circumference --      Peak Flow --      Pain Score 12/28/23 1913 0     Pain Loc --      Pain Education --      Exclude from Growth Chart --    No data found.  Updated Vital Signs BP 130/88 (BP Location: Left Arm)   Pulse (!) 57   Temp 98.3 F (36.8 C) (Oral)   Resp 16   SpO2 97%   Visual Acuity Right Eye Distance:  Left Eye Distance:   Bilateral Distance:    Right Eye Near:   Left Eye Near:    Bilateral Near:     Physical Exam Vitals reviewed.  Constitutional:      General: He is not in acute distress.    Appearance: He is not ill-appearing, toxic-appearing or diaphoretic.  HENT:     Head:     Comments: There is some swelling of the right mandible near the chin.    Mouth/Throat:     Mouth: Mucous membranes are moist.     Comments: There there are some broken carious teeth on the right lower dental ridge.  There is little swelling around that dental ridge.  He has on either side of the floor of his mouth 2 rounded white bumps that are about half a centimeter in diameter.  They are firm but not necessarily hard. Cardiovascular:     Rate and Rhythm: Normal rate and regular rhythm.     Heart sounds: No murmur heard. Pulmonary:     Effort: Pulmonary effort is normal.     Breath sounds: Normal breath sounds.  Musculoskeletal:     Cervical back: Neck supple.  Lymphadenopathy:     Cervical: No cervical adenopathy.  Skin:    Coloration: Skin is not jaundiced or pale.      Comments: There is a scattered maculopapular rash on his arms and chest.  Neurological:     General: No focal deficit present.     Mental Status: He is alert and oriented to person, place, and time.  Psychiatric:        Behavior: Behavior normal.      UC Treatments / Results  Labs (all labs ordered are listed, but only abnormal results are displayed) Labs Reviewed - No data to display  EKG   Radiology No results found.  Procedures Procedures (including critical care time)  Medications Ordered in UC Medications  dexamethasone (DECADRON) injection 10 mg (10 mg Intramuscular Given 12/28/23 1951)    Initial Impression / Assessment and Plan / UC Course  I have reviewed the triage vital signs and the nursing notes.  Pertinent labs & imaging results that were available during my care of the patient were reviewed by me and considered in my medical decision making (see chart for details).      Augmentin  is sent in for the dental infection.  Decadron is given here as an injection and prednisone is sent to the pharmacy for the contact dermatitis, that I think is most likely the cause of the rash  We discussed his searching for different dental provider; he states he has dental insurance Final Clinical Impressions(s) / UC Diagnoses   Final diagnoses:  Dental infection  Allergic contact dermatitis due to plants, except food     Discharge Instructions      You have been given a shot of dexamethasone 10 mg, steroid.  Take prednisone 20 mg--2 daily for 5 days  Take amoxicillin -clavulanate 875 mg--1 tab twice daily with food for 7 days      ED Prescriptions     Medication Sig Dispense Auth. Provider   amoxicillin -clavulanate (AUGMENTIN ) 875-125 MG tablet Take 1 tablet by mouth 2 (two) times daily for 7 days. 14 tablet Kenyen Candy K, MD   predniSONE (DELTASONE) 20 MG tablet Take 2 tablets (40 mg total) by mouth daily with breakfast for 5 days. 10 tablet Vonna  Tucker Minter K, MD      PDMP not reviewed this encounter.  Vonna Sharlet POUR, MD 12/28/23 2006

## 2023-12-28 NOTE — Discharge Instructions (Signed)
 You have been given a shot of dexamethasone 10 mg, steroid.  Take prednisone 20 mg--2 daily for 5 days  Take amoxicillin -clavulanate 875 mg--1 tab twice daily with food for 7 days

## 2023-12-28 NOTE — ED Triage Notes (Signed)
 Patient reports that he has had an abscess to the right lower jaw since around January. Patient states it drains, but when it closes up the area gets bigger.  Patient also c/o a rash to bilateral upper extremities, back and abdomen x 2 days.  Patient has been taking Zyrtec for his rash.
